# Patient Record
Sex: Male | Born: 1960 | Race: White | Hispanic: No | Marital: Married | Smoking: Current every day smoker
Health system: Southern US, Community
[De-identification: ages and names within clinical notes are randomized; demographics above are authoritative.]

## PROBLEM LIST (undated history)

## (undated) DIAGNOSIS — E7800 Pure hypercholesterolemia, unspecified: Secondary | ICD-10-CM

## (undated) DIAGNOSIS — K6389 Other specified diseases of intestine: Secondary | ICD-10-CM

## (undated) DIAGNOSIS — R000 Tachycardia, unspecified: Secondary | ICD-10-CM

## (undated) DIAGNOSIS — R05 Cough: Secondary | ICD-10-CM

## (undated) HISTORY — PX: OTHER SURGICAL HISTORY: SHX169

## (undated) HISTORY — DX: Other specified diseases of intestine: K63.89

---

## 2013-03-25 ENCOUNTER — Encounter (INDEPENDENT_AMBULATORY_CARE_PROVIDER_SITE_OTHER): Payer: Self-pay

## 2013-03-27 ENCOUNTER — Ambulatory Visit (INDEPENDENT_AMBULATORY_CARE_PROVIDER_SITE_OTHER): Payer: BC Managed Care – PPO | Admitting: General Surgery

## 2013-03-27 ENCOUNTER — Telehealth (INDEPENDENT_AMBULATORY_CARE_PROVIDER_SITE_OTHER): Payer: Self-pay

## 2013-03-27 ENCOUNTER — Encounter (INDEPENDENT_AMBULATORY_CARE_PROVIDER_SITE_OTHER): Payer: Self-pay | Admitting: General Surgery

## 2013-03-27 ENCOUNTER — Encounter (INDEPENDENT_AMBULATORY_CARE_PROVIDER_SITE_OTHER): Payer: Self-pay

## 2013-03-27 VITALS — BP 162/100 | HR 88 | Temp 98.9°F | Resp 15 | Ht 70.0 in | Wt 198.8 lb

## 2013-03-27 DIAGNOSIS — D126 Benign neoplasm of colon, unspecified: Secondary | ICD-10-CM

## 2013-03-27 NOTE — Progress Notes (Signed)
Patient ID: Wesley Tran, male   DOB: 11-05-1960, 52 y.o.   MRN: [id number]  Chief Complaint  Patient presents with  . New Evaluation    eval sigmoid mass/colon ca    HPI Wesley Tran is a 52 y.o. male.   HPI  He is referred by Dr. Ewing Schlein for further evaluation and treatment of a sigmoid colon mass. He is here with his wife and his employer.  He underwent a screening colonoscopy at the end of August. This was done in [place]. This demonstrated a polyp in the transverse colon which was removed. This demonstrated a moderate to large mass in the sigmoid colon which was biopsied. The transverse colon polyp was benign. The sigmoid mass demonstrated a tubulovillous adenoma. CT scan was performed which demonstrated the mass in the rectosigmoid area. CEA level was normal. He is asymptomatic and has no rectal bleeding. He saw Dr. Ewing Schlein for a second opinion with respect to treatment options and then was referred to me.  Past Medical History  Diagnosis Date  . Colonic mass     History reviewed. No pertinent past surgical history.  Family History  Problem Relation Age of Onset  . Cancer Mother     ovarian    Social History History  Substance Use Topics  . Smoking status: Current Every Day Smoker -- 0.50 packs/day  . Smokeless tobacco: Never Used  . Alcohol Use: Yes     Comment: weekend drinker    Not on File  No current outpatient prescriptions on file.   No current facility-administered medications for this visit.    Review of Systems Review of Systems  Constitutional: Negative.   HENT: Negative.   Respiratory: Negative.   Cardiovascular: Negative.   Gastrointestinal: Negative.   Genitourinary: Negative.   Musculoskeletal: Negative.   Neurological: Negative.   Hematological: Negative.     Blood pressure 162/100, pulse 88, temperature 98.9 F (37.2 C), temperature source Temporal, resp. rate 15, height 5\' 10"  (1.778 m), weight 198 lb 12.8 oz (90.175 kg).  Physical  Exam Physical Exam  Constitutional: He appears well-developed and well-nourished. No distress.  HENT:  Head: Normocephalic and atraumatic.  Eyes: EOM are normal. No scleral icterus.  Neck: Neck supple.  Cardiovascular: Normal rate and regular rhythm.   Pulmonary/Chest: Effort normal and breath sounds normal.  Abdominal: Soft. He exhibits no distension and no mass. There is no tenderness.  Musculoskeletal: He exhibits no edema.  Lymphadenopathy:    He has no cervical adenopathy.  Neurological: He is alert.  Skin: Skin is warm and dry.  Psychiatric: He has a normal mood and affect. His behavior is normal.    Data Reviewed Colonoscopy report. Pathology report. CT scan report. Note from Dr. Ewing Schlein.  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 This cannot be removed endoscopically. I explained to him that there was a chance that was harboring malignancy.     Plan    Laparoscopic-assisted partial colectomy.  I have explained the procedure and risks of colon resection.  Risks include but are not limited to bleeding, infection, wound problems, anesthesia, anastomotic leak, need for colostomy, injury to intraabominal organs (such as intestine, spleen, kidney, bladder, ureter, etc.), ileus, irregular bowel habits.  He seems to understand and agrees to proceed.   He will be given a one-day bowel prep.       Ebbie Cherry J 03/27/2013, 12:53 PM

## 2013-03-27 NOTE — Telephone Encounter (Signed)
No notes in encounter. 

## 2013-03-27 NOTE — Patient Instructions (Signed)
[facility]  ONE-DAY (1) PRE-OP HOME COLON PREP INSTRUCTIONS: ** MIRALAX / GATORADE PREP **  Fill the two prescriptions at a pharmacy of your choice.  You must follow the instructions below carefully.  If you have questions or problems, please call and speak to someone in the clinic department at our office:   [phone].  MIRALAX - GATORADE -- DULCOLAX TABS:   Fill the prescriptions for MIRALAX  (255 gm bottle)    In addition, purchase four (4) DULCOLAX TABLETS (no prescription required), and one 64 oz GATORADE.  (Do NOT purchase red Gatorade; any other flavor is acceptable).  ANITIBIOTICS:   There will be 2 different antibiotics.     Take both prescriptions THE AFTERNOON BEFORE your surgery, at the times written on the bottles.  INSTRUCTIONS: 1. Five days prior to your procedure do not eat nuts, popcorn, or fruit with seeds.  Stop all fiber supplements such as Metamucil, Citrucel, etc.  2. The day before your procedure: o 6:00am:  take (4) Dulcolax tablets.  You should remain on clear liquids for the entire day.   CLEAR LIQUIDS: clear bouillon, broth, jello (NOT RED), black coffee, tea, soda, etc o 10:00am:  add the bottle of MiraLax to the 64-oz bottle of Gatorade, and dissolve.  Begin drinking the Gatorade mixture until gone (8 oz every 15-30 minutes).  Continue clear liquids until midnight (or bedtime). o Take the antibiotics at the times instructed on the bottles.  3. The day of your procedure:   Do not eat or drink ANYTHING after midnight before your surgery.     If you take Heart or Blood Pressure medicine, ask the pre-op nurses about these during your preop appointment.   Further pre-operative instructions will be given to you from the hospital.   Expect to be contacted 5-7 days before your surgery.       

## 2013-04-10 ENCOUNTER — Encounter (INDEPENDENT_AMBULATORY_CARE_PROVIDER_SITE_OTHER): Payer: Self-pay

## 2013-04-16 ENCOUNTER — Encounter (HOSPITAL_COMMUNITY): Payer: Self-pay | Admitting: Pharmacy Technician

## 2013-04-17 NOTE — Patient Instructions (Signed)
20 [name]  04/17/2013   Your procedure is scheduled on:  04/30/13  TUESDAY  Report to [facility] at  0530     AM.  Call this number if you have problems the morning of surgery: [phone]       Remember: BOWEL PREP AS PER OFFICE  Do not  Take ANYTHING BY MOUTH :After Midnight. Monday NIGHT   Take these medicines the morning of surgery with A SIP OF WATER:none   .  Contacts, dentures or partial plates can not be worn to surgery  Leave suitcase in the car. After surgery it may be brought to your room.  For patients admitted to the hospital, checkout time is 11:00 AM day of  discharge.             SPECIAL INSTRUCTIONS- SEE  PREPARING FOR SURGERY INSTRUCTION SHEET-     DO NOT WEAR JEWELRY, LOTIONS, POWDERS, OR PERFUMES.  WOMEN-- DO NOT SHAVE LEGS OR UNDERARMS FOR 12 HOURS BEFORE SHOWERS. MEN MAY SHAVE FACE.  Patients discharged the day of surgery will not be allowed to drive home. IF going home the day of surgery, you must have a driver and someone to stay with you for the first 24 hours  Name and phone number of your driver:   ADMISSION                                                                                  [name]  [phone]  [phone]                 FAILURE TO FOLLOW THESE INSTRUCTIONS MAY RESULT IN  CANCELLATION   OF YOUR SURGERY                                                  Patient Signature _____________________________

## 2013-04-18 ENCOUNTER — Encounter (HOSPITAL_COMMUNITY)
Admission: RE | Admit: 2013-04-18 | Discharge: 2013-04-18 | Disposition: A | Discharge status: Home / Self Care | Payer: BC Managed Care – PPO | Source: Ambulatory Visit | Attending: General Surgery | Admitting: General Surgery

## 2013-04-18 ENCOUNTER — Other Ambulatory Visit: Payer: Self-pay

## 2013-04-18 ENCOUNTER — Ambulatory Visit (HOSPITAL_COMMUNITY)
Admission: RE | Admit: 2013-04-18 | Discharge: 2013-04-18 | Disposition: A | Discharge status: Home / Self Care | Payer: BC Managed Care – PPO | Source: Ambulatory Visit | Attending: General Surgery | Admitting: General Surgery

## 2013-04-18 ENCOUNTER — Telehealth (INDEPENDENT_AMBULATORY_CARE_PROVIDER_SITE_OTHER): Payer: Self-pay | Admitting: *Deleted

## 2013-04-18 ENCOUNTER — Encounter (HOSPITAL_COMMUNITY): Payer: Self-pay

## 2013-04-18 DIAGNOSIS — Z01810 Encounter for preprocedural cardiovascular examination: Secondary | ICD-10-CM | POA: Insufficient documentation

## 2013-04-18 DIAGNOSIS — R059 Cough, unspecified: Secondary | ICD-10-CM

## 2013-04-18 DIAGNOSIS — Z01812 Encounter for preprocedural laboratory examination: Secondary | ICD-10-CM | POA: Insufficient documentation

## 2013-04-18 DIAGNOSIS — Z01818 Encounter for other preprocedural examination: Secondary | ICD-10-CM | POA: Insufficient documentation

## 2013-04-18 DIAGNOSIS — R000 Tachycardia, unspecified: Secondary | ICD-10-CM

## 2013-04-18 HISTORY — DX: Tachycardia, unspecified: R00.0

## 2013-04-18 HISTORY — DX: Cough, unspecified: R05.9

## 2013-04-18 HISTORY — DX: Cough: R05

## 2013-04-18 HISTORY — DX: Pure hypercholesterolemia, unspecified: E78.00

## 2013-04-18 LAB — PROTIME-INR
INR: 1.01 (ref 0.00–1.49)
Prothrombin Time: 13.1 seconds (ref 11.6–15.2)

## 2013-04-18 LAB — CBC WITH DIFFERENTIAL/PLATELET
Basophils Relative: 0 % (ref 0–1)
Eosinophils Absolute: 0.2 10*3/uL (ref 0.0–0.7)
Eosinophils Relative: 1 % (ref 0–5)
HCT: 44.3 % (ref 39.0–52.0)
Hemoglobin: 14.4 g/dL (ref 13.0–17.0)
MCH: 30.5 pg (ref 26.0–34.0)
MCHC: 32.5 g/dL (ref 30.0–36.0)
MCV: 93.9 fL (ref 78.0–100.0)
Monocytes Absolute: 1.6 10*3/uL — ABNORMAL HIGH (ref 0.1–1.0)
Monocytes Relative: 8 % (ref 3–12)
RBC: 4.72 MIL/uL (ref 4.22–5.81)

## 2013-04-18 LAB — COMPREHENSIVE METABOLIC PANEL
Albumin: 3.7 g/dL (ref 3.5–5.2)
BUN: 17 mg/dL (ref 6–23)
Calcium: 9.9 mg/dL (ref 8.4–10.5)
Creatinine, Ser: 0.78 mg/dL (ref 0.50–1.35)
Total Protein: 7.7 g/dL (ref 6.0–8.3)

## 2013-04-18 NOTE — Telephone Encounter (Signed)
Needs to seen by PMD ASAP.

## 2013-04-18 NOTE — Progress Notes (Signed)
CT chest  8/14 EPIC

## 2013-04-18 NOTE — Progress Notes (Signed)
PST VISIT NOTE-  Patient and wife stated he had a fever to  102 on Saturday night. hasnt had any since but is coughing with nasal drainage- ? Color. He and wife state he will follow up with PCP today or tomorrow. Coughing here at visit- sounds loose, but congested

## 2013-04-18 NOTE — Telephone Encounter (Signed)
Received a call from [place] with Pre-op stating that patient told her that on Saturday he had a 102F temperature but has not had a fever since.  Patient has had continued coughing and congestion.  Patient was advised to follow up with PMD but did have labs drawn today that showed WBC of 19.2.  Verlon Au just wanted to make sure Dr. Abbey Chatters was aware of what was going on.  Explained that I will send a message to Dr. Abbey Chatters to make him aware.

## 2013-04-18 NOTE — Progress Notes (Signed)
[place] [employer] at PST visit- Adrian Saran- # [id number] Amil Amen utilized for medications, allergies and surgical consent.  Phone became disconnected and both patient and husband did not want the call to be returned. Both said they understood english and did not need or want the interpretor.  Faxed over CBC and CMP to Dr Abbey Chatters along with note regarding patients cough.  Also spoke with Baxter Hire at [facility] who will be sure Dr Abbey Chatters is aware

## 2013-04-29 NOTE — Progress Notes (Signed)
Spoke with patient's wife about follow-up with primary care physcian. She stated he saw PCP and was placed on antibiotics.

## 2013-04-29 NOTE — Anesthesia Preprocedure Evaluation (Addendum)
Anesthesia Evaluation  Patient identified by MRN, date of birth, ID band Patient awake    Reviewed: Allergy & Precautions, H&P , NPO status , Patient's Chart, lab work & pertinent test results  Airway Mallampati: II TM Distance: >3 FB Neck ROM: Full    Dental  (+) Teeth Intact and Dental Advisory Given   Pulmonary neg pulmonary ROS, Current Smoker,  breath sounds clear to auscultation  Pulmonary exam normal       Cardiovascular negative cardio ROS  Rhythm:Regular Rate:Normal     Neuro/Psych negative neurological ROS  negative psych ROS   GI/Hepatic Neg liver ROS, Sigmoid colon mass   Endo/Other  negative endocrine ROS  Renal/GU negative Renal ROS  negative genitourinary   Musculoskeletal negative musculoskeletal ROS (+)   Abdominal   Peds  Hematology negative hematology ROS (+)   Anesthesia Other Findings   Reproductive/Obstetrics                          Anesthesia Physical Anesthesia Plan  ASA: II  Anesthesia Plan: General   Post-op Pain Management:    Induction: Intravenous  Airway Management Planned: Oral ETT  Additional Equipment:   Intra-op Plan:   Post-operative Plan: Extubation in OR  Informed Consent: I have reviewed the patients History and Physical, chart, labs and discussed the procedure including the risks, benefits and alternatives for the proposed anesthesia with the patient or authorized representative who has indicated his/her understanding and acceptance.   Dental advisory given  Plan Discussed with: CRNA  Anesthesia Plan Comments:         Anesthesia Quick Evaluation

## 2013-04-30 ENCOUNTER — Inpatient Hospital Stay (HOSPITAL_COMMUNITY): Payer: BC Managed Care – PPO | Admitting: Anesthesiology

## 2013-04-30 ENCOUNTER — Encounter (HOSPITAL_COMMUNITY): Payer: BC Managed Care – PPO | Admitting: Anesthesiology

## 2013-04-30 ENCOUNTER — Inpatient Hospital Stay (HOSPITAL_COMMUNITY)
Admission: RE | Admit: 2013-04-30 | Discharge: 2013-05-06 | DRG: 333 | Disposition: A | Discharge status: Home / Self Care | Payer: BC Managed Care – PPO | Source: Ambulatory Visit | Attending: General Surgery | Admitting: General Surgery

## 2013-04-30 ENCOUNTER — Encounter (HOSPITAL_COMMUNITY)
Admission: RE | Disposition: A | Discharge status: Home / Self Care | Payer: Self-pay | Source: Ambulatory Visit | Attending: General Surgery

## 2013-04-30 ENCOUNTER — Encounter (HOSPITAL_COMMUNITY): Payer: Self-pay | Admitting: *Deleted

## 2013-04-30 DIAGNOSIS — Q438 Other specified congenital malformations of intestine: Secondary | ICD-10-CM

## 2013-04-30 DIAGNOSIS — IMO0002 Reserved for concepts with insufficient information to code with codable children: Secondary | ICD-10-CM | POA: Diagnosis not present

## 2013-04-30 DIAGNOSIS — Z79899 Other long term (current) drug therapy: Secondary | ICD-10-CM

## 2013-04-30 DIAGNOSIS — Z01812 Encounter for preprocedural laboratory examination: Secondary | ICD-10-CM

## 2013-04-30 DIAGNOSIS — D371 Neoplasm of uncertain behavior of stomach: Secondary | ICD-10-CM

## 2013-04-30 DIAGNOSIS — K560 Paralytic ileus: Secondary | ICD-10-CM | POA: Diagnosis not present

## 2013-04-30 DIAGNOSIS — Z01810 Encounter for preprocedural cardiovascular examination: Secondary | ICD-10-CM

## 2013-04-30 DIAGNOSIS — Y836 Removal of other organ (partial) (total) as the cause of abnormal reaction of the patient, or of later complication, without mention of misadventure at the time of the procedure: Secondary | ICD-10-CM | POA: Diagnosis not present

## 2013-04-30 DIAGNOSIS — K6289 Other specified diseases of anus and rectum: Secondary | ICD-10-CM | POA: Insufficient documentation

## 2013-04-30 DIAGNOSIS — D126 Benign neoplasm of colon, unspecified: Secondary | ICD-10-CM | POA: Diagnosis present

## 2013-04-30 DIAGNOSIS — Y839 Surgical procedure, unspecified as the cause of abnormal reaction of the patient, or of later complication, without mention of misadventure at the time of the procedure: Secondary | ICD-10-CM | POA: Diagnosis not present

## 2013-04-30 DIAGNOSIS — K929 Disease of digestive system, unspecified: Secondary | ICD-10-CM | POA: Diagnosis present

## 2013-04-30 DIAGNOSIS — N9989 Other postprocedural complications and disorders of genitourinary system: Secondary | ICD-10-CM | POA: Diagnosis not present

## 2013-04-30 DIAGNOSIS — R339 Retention of urine, unspecified: Secondary | ICD-10-CM | POA: Diagnosis not present

## 2013-04-30 DIAGNOSIS — D375 Neoplasm of uncertain behavior of rectum: Secondary | ICD-10-CM

## 2013-04-30 DIAGNOSIS — F17200 Nicotine dependence, unspecified, uncomplicated: Secondary | ICD-10-CM | POA: Diagnosis present

## 2013-04-30 DIAGNOSIS — E7800 Pure hypercholesterolemia, unspecified: Secondary | ICD-10-CM | POA: Diagnosis present

## 2013-04-30 DIAGNOSIS — D128 Benign neoplasm of rectum: Principal | ICD-10-CM | POA: Diagnosis present

## 2013-04-30 DIAGNOSIS — Z791 Long term (current) use of non-steroidal anti-inflammatories (NSAID): Secondary | ICD-10-CM

## 2013-04-30 DIAGNOSIS — D378 Neoplasm of uncertain behavior of other specified digestive organs: Secondary | ICD-10-CM

## 2013-04-30 HISTORY — PX: LAPAROSCOPIC PARTIAL COLECTOMY: SHX5907

## 2013-04-30 LAB — TYPE AND SCREEN
ABO/RH(D): O POS
Antibody Screen: NEGATIVE

## 2013-04-30 LAB — CBC
HCT: 43.7 % (ref 39.0–52.0)
Hemoglobin: 13.8 g/dL (ref 13.0–17.0)
MCH: 31.3 pg (ref 26.0–34.0)
MCHC: 33.9 g/dL (ref 30.0–36.0)
MCV: 93.1 fL (ref 78.0–100.0)
Platelets: 289 10*3/uL (ref 150–400)
RBC: 4.5 MIL/uL (ref 4.22–5.81)
RDW: 13.2 % (ref 11.5–15.5)
WBC: 12.8 10*3/uL — ABNORMAL HIGH (ref 4.0–10.5)
WBC: 27.8 10*3/uL — ABNORMAL HIGH (ref 4.0–10.5)

## 2013-04-30 LAB — CREATININE, SERUM
Creatinine, Ser: 0.69 mg/dL (ref 0.50–1.35)
GFR calc Af Amer: >90 mL/min (ref >90)
GFR calc non Af Amer: >90 mL/min (ref >90)

## 2013-04-30 LAB — ABO/RH: ABO/RH(D): O POS

## 2013-04-30 SURGERY — LAPAROSCOPIC PARTIAL COLECTOMY
Anesthesia: General | Site: Abdomen

## 2013-04-30 MED ORDER — LACTATED RINGERS IV SOLN
INTRAVENOUS | Status: DC | PRN
  Administered 2013-04-30 (×3): via INTRAVENOUS

## 2013-04-30 MED ORDER — NEOSTIGMINE METHYLSULFATE 1 MG/ML IJ SOLN
INTRAMUSCULAR | Status: DC | PRN
  Administered 2013-04-30: 4 mg via INTRAVENOUS

## 2013-04-30 MED ORDER — ONDANSETRON HCL 4 MG/2ML IJ SOLN
4.0000 mg | INTRAMUSCULAR | Status: DC | PRN
  Administered 2013-05-03 – 2013-05-05 (×7): 4 mg via INTRAVENOUS
  Filled 2013-04-30 (×7): qty 2

## 2013-04-30 MED ORDER — PROMETHAZINE HCL 25 MG/ML IJ SOLN: 6.2500 mg | INTRAMUSCULAR | Status: DC | PRN

## 2013-04-30 MED ORDER — SUFENTANIL CITRATE 50 MCG/ML IV SOLN
INTRAVENOUS | Status: DC | PRN
  Administered 2013-04-30: 5 ug via INTRAVENOUS
  Administered 2013-04-30: 10 ug via INTRAVENOUS
  Administered 2013-04-30 (×2): 5 ug via INTRAVENOUS
  Administered 2013-04-30 (×2): 10 ug via INTRAVENOUS
  Administered 2013-04-30 (×3): 5 ug via INTRAVENOUS
  Administered 2013-04-30 (×2): 10 ug via INTRAVENOUS
  Administered 2013-04-30 (×2): 5 ug via INTRAVENOUS

## 2013-04-30 MED ORDER — HYDROMORPHONE HCL PF 1 MG/ML IJ SOLN
INTRAMUSCULAR | Status: AC
  Filled 2013-04-30: qty 1

## 2013-04-30 MED ORDER — SUFENTANIL CITRATE 50 MCG/ML IV SOLN
INTRAVENOUS | Status: AC
  Filled 2013-04-30: qty 1

## 2013-04-30 MED ORDER — DIPHENHYDRAMINE HCL 12.5 MG/5ML PO ELIX: 12.5000 mg | ORAL_SOLUTION | Freq: Four times a day (QID) | ORAL | Status: DC | PRN

## 2013-04-30 MED ORDER — ONDANSETRON HCL 4 MG/2ML IJ SOLN: 4.0000 mg | Freq: Four times a day (QID) | INTRAMUSCULAR | Status: DC | PRN

## 2013-04-30 MED ORDER — HYDROMORPHONE HCL PF 1 MG/ML IJ SOLN
INTRAMUSCULAR | Status: DC | PRN
  Administered 2013-04-30 (×4): 0.5 mg via INTRAVENOUS

## 2013-04-30 MED ORDER — DIPHENHYDRAMINE HCL 50 MG/ML IJ SOLN: 12.5000 mg | Freq: Four times a day (QID) | INTRAMUSCULAR | Status: DC | PRN

## 2013-04-30 MED ORDER — ESMOLOL HCL 10 MG/ML IV SOLN
INTRAVENOUS | Status: DC | PRN
  Administered 2013-04-30: 20 mg via INTRAVENOUS

## 2013-04-30 MED ORDER — DEXAMETHASONE SODIUM PHOSPHATE 10 MG/ML IJ SOLN
INTRAMUSCULAR | Status: AC
  Filled 2013-04-30: qty 1

## 2013-04-30 MED ORDER — LACTATED RINGERS IR SOLN
Status: DC | PRN
  Administered 2013-04-30: 1000 mL

## 2013-04-30 MED ORDER — MORPHINE SULFATE (PF) 1 MG/ML IV SOLN
INTRAVENOUS | Status: AC
  Administered 2013-04-30: 18:00:00
  Administered 2013-04-30: 1.5 mg
  Filled 2013-04-30: qty 25

## 2013-04-30 MED ORDER — MIDAZOLAM HCL 5 MG/5ML IJ SOLN
INTRAMUSCULAR | Status: DC | PRN
  Administered 2013-04-30 (×2): 1 mg via INTRAVENOUS

## 2013-04-30 MED ORDER — CISATRACURIUM BESYLATE (PF) 10 MG/5ML IV SOLN
INTRAVENOUS | Status: DC | PRN
  Administered 2013-04-30 (×2): 4 mg via INTRAVENOUS
  Administered 2013-04-30: 5 mg via INTRAVENOUS
  Administered 2013-04-30: 3 mg via INTRAVENOUS

## 2013-04-30 MED ORDER — SUCCINYLCHOLINE CHLORIDE 20 MG/ML IJ SOLN
INTRAMUSCULAR | Status: AC
  Filled 2013-04-30: qty 1

## 2013-04-30 MED ORDER — ONDANSETRON HCL 4 MG/2ML IJ SOLN
INTRAMUSCULAR | Status: DC | PRN
  Administered 2013-04-30: 4 mg via INTRAVENOUS

## 2013-04-30 MED ORDER — SODIUM CHLORIDE 0.9 % IJ SOLN: 9.0000 mL | INTRAMUSCULAR | Status: DC | PRN

## 2013-04-30 MED ORDER — DEXAMETHASONE SODIUM PHOSPHATE 10 MG/ML IJ SOLN
INTRAMUSCULAR | Status: DC | PRN
  Administered 2013-04-30: 5 mg via INTRAVENOUS

## 2013-04-30 MED ORDER — MORPHINE SULFATE (PF) 1 MG/ML IV SOLN
INTRAVENOUS | Status: DC
  Administered 2013-04-30: 6 mg via INTRAVENOUS
  Administered 2013-04-30: 18 mg via INTRAVENOUS
  Administered 2013-04-30: 12:00:00 via INTRAVENOUS
  Administered 2013-05-01: 1.5 mg via INTRAVENOUS
  Administered 2013-05-01: 7.5 mg via INTRAVENOUS
  Administered 2013-05-01: 5.56 mg via INTRAVENOUS
  Administered 2013-05-01 (×2): 4.5 mg via INTRAVENOUS
  Administered 2013-05-01: 13:00:00 via INTRAVENOUS
  Administered 2013-05-02: 3 mg via INTRAVENOUS
  Administered 2013-05-02: 19:00:00 via INTRAVENOUS
  Administered 2013-05-02: 7.35 mg via INTRAVENOUS
  Administered 2013-05-02: 4.5 mg via INTRAVENOUS
  Administered 2013-05-02 – 2013-05-03 (×3): 3 mg via INTRAVENOUS
  Administered 2013-05-03: 2.52 mg via INTRAVENOUS
  Administered 2013-05-03: 7.5 mg via INTRAVENOUS
  Administered 2013-05-03 – 2013-05-05 (×4): 1.5 mg via INTRAVENOUS
  Filled 2013-04-30 (×4): qty 25

## 2013-04-30 MED ORDER — HYDROMORPHONE HCL PF 1 MG/ML IJ SOLN
0.2500 mg | INTRAMUSCULAR | Status: DC | PRN
  Administered 2013-04-30 (×2): 0.5 mg via INTRAVENOUS

## 2013-04-30 MED ORDER — HYDROMORPHONE HCL PF 2 MG/ML IJ SOLN
INTRAMUSCULAR | Status: AC
  Filled 2013-04-30: qty 1

## 2013-04-30 MED ORDER — GLYCOPYRROLATE 0.2 MG/ML IJ SOLN
INTRAMUSCULAR | Status: DC | PRN
  Administered 2013-04-30: 0.6 mg via INTRAVENOUS

## 2013-04-30 MED ORDER — CISATRACURIUM BESYLATE 20 MG/10ML IV SOLN
INTRAVENOUS | Status: AC
  Filled 2013-04-30: qty 10

## 2013-04-30 MED ORDER — BUPIVACAINE HCL (PF) 0.5 % IJ SOLN
INTRAMUSCULAR | Status: AC
  Filled 2013-04-30: qty 30

## 2013-04-30 MED ORDER — MIDAZOLAM HCL 2 MG/2ML IJ SOLN
INTRAMUSCULAR | Status: AC
  Filled 2013-04-30: qty 2

## 2013-04-30 MED ORDER — DEXTROSE 5 % IV SOLN
2.0000 g | Freq: Two times a day (BID) | INTRAVENOUS | Status: AC
  Administered 2013-04-30: 2 g via INTRAVENOUS
  Filled 2013-04-30: qty 2

## 2013-04-30 MED ORDER — BUPIVACAINE HCL (PF) 0.5 % IJ SOLN
INTRAMUSCULAR | Status: DC | PRN
  Administered 2013-04-30: 10 mL

## 2013-04-30 MED ORDER — DEXTROSE 5 % IV SOLN
INTRAVENOUS | Status: AC
  Filled 2013-04-30: qty 1

## 2013-04-30 MED ORDER — DEXTROSE 5 % IV SOLN
2.0000 g | INTRAVENOUS | Status: AC
  Administered 2013-04-30: 2 g via INTRAVENOUS
  Filled 2013-04-30: qty 2

## 2013-04-30 MED ORDER — LACTATED RINGERS IV SOLN
INTRAVENOUS | Status: DC
  Administered 2013-04-30: 13:00:00 via INTRAVENOUS

## 2013-04-30 MED ORDER — HEPARIN SODIUM (PORCINE) 5000 UNIT/ML IJ SOLN
5000.0000 [IU] | Freq: Three times a day (TID) | INTRAMUSCULAR | Status: DC
  Administered 2013-05-01 – 2013-05-06 (×16): 5000 [IU] via SUBCUTANEOUS
  Filled 2013-04-30 (×19): qty 1

## 2013-04-30 MED ORDER — METOCLOPRAMIDE HCL 5 MG/ML IJ SOLN
INTRAMUSCULAR | Status: DC | PRN
  Administered 2013-04-30: 10 mg via INTRAVENOUS

## 2013-04-30 MED ORDER — SPOT INK MARKER SYRINGE KIT
PACK | SUBMUCOSAL | Status: AC
  Filled 2013-04-30: qty 5

## 2013-04-30 MED ORDER — GLYCOPYRROLATE 0.2 MG/ML IJ SOLN
INTRAMUSCULAR | Status: AC
  Filled 2013-04-30: qty 1

## 2013-04-30 MED ORDER — NALOXONE HCL 0.4 MG/ML IJ SOLN: 0.4000 mg | INTRAMUSCULAR | Status: DC | PRN

## 2013-04-30 MED ORDER — SUCCINYLCHOLINE CHLORIDE 20 MG/ML IJ SOLN
INTRAMUSCULAR | Status: DC | PRN
  Administered 2013-04-30: 100 mg via INTRAVENOUS

## 2013-04-30 MED ORDER — SODIUM CHLORIDE 0.9 % IJ SOLN
INTRAMUSCULAR | Status: AC
  Filled 2013-04-30: qty 20

## 2013-04-30 MED ORDER — EPHEDRINE SULFATE 50 MG/ML IJ SOLN
INTRAMUSCULAR | Status: AC
  Filled 2013-04-30: qty 1

## 2013-04-30 MED ORDER — KCL-LACTATED RINGERS-D5W 20 MEQ/L IV SOLN
INTRAVENOUS | Status: DC
  Administered 2013-05-01 – 2013-05-04 (×8): via INTRAVENOUS
  Administered 2013-05-04: 80 mL/h via INTRAVENOUS
  Administered 2013-05-05: 03:00:00 via INTRAVENOUS
  Administered 2013-05-05: 80 mL/h via INTRAVENOUS
  Administered 2013-05-06: 04:00:00 via INTRAVENOUS
  Filled 2013-04-30 (×20): qty 1000

## 2013-04-30 MED ORDER — BIOTENE DRY MOUTH MT LIQD
15.0000 mL | Freq: Two times a day (BID) | OROMUCOSAL | Status: DC
  Administered 2013-04-30 – 2013-05-05 (×7): 15 mL via OROMUCOSAL

## 2013-04-30 MED ORDER — SODIUM CHLORIDE 0.9 % IJ SOLN
INTRAMUSCULAR | Status: AC
  Filled 2013-04-30: qty 10

## 2013-04-30 MED ORDER — ONDANSETRON HCL 4 MG PO TABS: 4.0000 mg | ORAL_TABLET | Freq: Four times a day (QID) | ORAL | Status: DC | PRN

## 2013-04-30 MED ORDER — LIDOCAINE HCL (CARDIAC) 20 MG/ML IV SOLN
INTRAVENOUS | Status: DC | PRN
  Administered 2013-04-30: 60 mg via INTRAVENOUS

## 2013-04-30 MED ORDER — ATROPINE SULFATE 0.4 MG/ML IJ SOLN
INTRAMUSCULAR | Status: AC
  Filled 2013-04-30: qty 1

## 2013-04-30 MED ORDER — PANTOPRAZOLE SODIUM 40 MG IV SOLR
40.0000 mg | INTRAVENOUS | Status: DC
  Administered 2013-04-30 – 2013-05-05 (×6): 40 mg via INTRAVENOUS
  Filled 2013-04-30 (×7): qty 40

## 2013-04-30 MED ORDER — PROPOFOL 10 MG/ML IV BOLUS
INTRAVENOUS | Status: AC
  Filled 2013-04-30: qty 20

## 2013-04-30 MED ORDER — GLYCOPYRROLATE 0.2 MG/ML IJ SOLN
INTRAMUSCULAR | Status: AC
  Filled 2013-04-30: qty 2

## 2013-04-30 MED ORDER — PROPOFOL 10 MG/ML IV BOLUS
INTRAVENOUS | Status: DC | PRN
  Administered 2013-04-30: 200 mg via INTRAVENOUS

## 2013-04-30 MED ORDER — NEOSTIGMINE METHYLSULFATE 1 MG/ML IJ SOLN
INTRAMUSCULAR | Status: AC
  Filled 2013-04-30: qty 10

## 2013-04-30 MED ORDER — ALVIMOPAN 12 MG PO CAPS: 12.0000 mg | ORAL_CAPSULE | Freq: Two times a day (BID) | ORAL | Status: DC

## 2013-04-30 SURGICAL SUPPLY — 73 items
APPLIER CLIP 5 13 M/L LIGAMAX5 (MISCELLANEOUS)
APPLIER CLIP ROT 10 11.4 M/L (STAPLE)
BLADE EXTENDED COATED 6.5IN (ELECTRODE) ×2 IMPLANT
BLADE HEX COATED 2.75 (ELECTRODE) ×4 IMPLANT
BLADE SURG SZ10 CARB STEEL (BLADE) ×2 IMPLANT
CABLE HIGH FREQUENCY MONO STRZ (ELECTRODE) ×2 IMPLANT
CANISTER SUCTION 2500CC (MISCELLANEOUS) ×2 IMPLANT
CELLS DAT CNTRL 66122 CELL SVR (MISCELLANEOUS) IMPLANT
CLIP APPLIE 5 13 M/L LIGAMAX5 (MISCELLANEOUS) IMPLANT
CLIP APPLIE ROT 10 11.4 M/L (STAPLE) IMPLANT
COUNTER NEEDLE 20 DBL MAG RED (NEEDLE) ×2 IMPLANT
COVER MAYO STAND STRL (DRAPES) ×4 IMPLANT
DECANTER SPIKE VIAL GLASS SM (MISCELLANEOUS) ×2 IMPLANT
DISSECTOR BLUNT TIP ENDO 5MM (MISCELLANEOUS) IMPLANT
DRAIN CHANNEL 19F RND (DRAIN) ×2 IMPLANT
DRAPE LAPAROSCOPIC ABDOMINAL (DRAPES) ×2 IMPLANT
DRAPE LG THREE QUARTER DISP (DRAPES) ×6 IMPLANT
DRAPE UTILITY XL STRL (DRAPES) ×4 IMPLANT
DRAPE WARM FLUID 44X44 (DRAPE) ×2 IMPLANT
DRSG OPSITE POSTOP 4X10 (GAUZE/BANDAGES/DRESSINGS) IMPLANT
DRSG OPSITE POSTOP 4X6 (GAUZE/BANDAGES/DRESSINGS) IMPLANT
DRSG OPSITE POSTOP 4X8 (GAUZE/BANDAGES/DRESSINGS) ×2 IMPLANT
ELECT REM PT RETURN 9FT ADLT (ELECTROSURGICAL) ×2
ELECTRODE REM PT RTRN 9FT ADLT (ELECTROSURGICAL) ×1 IMPLANT
EVACUATOR SILICONE 100CC (DRAIN) ×2 IMPLANT
FILTER SMOKE EVAC LAPAROSHD (FILTER) IMPLANT
GLOVE ECLIPSE 8.0 STRL XLNG CF (GLOVE) ×8 IMPLANT
GLOVE INDICATOR 8.0 STRL GRN (GLOVE) ×8 IMPLANT
GOWN STRL REIN XL XLG (GOWN DISPOSABLE) ×12 IMPLANT
KIT BASIN OR (CUSTOM PROCEDURE TRAY) ×2 IMPLANT
LEGGING LITHOTOMY PAIR STRL (DRAPES) ×2 IMPLANT
LIGASURE IMPACT 36 18CM CVD LR (INSTRUMENTS) ×2 IMPLANT
PENCIL BUTTON HOLSTER BLD 10FT (ELECTRODE) ×4 IMPLANT
RELOAD PROXIMATE 75MM BLUE (ENDOMECHANICALS) ×2 IMPLANT
RTRCTR WOUND ALEXIS 18CM MED (MISCELLANEOUS)
SCALPEL HARMONIC ACE (MISCELLANEOUS) IMPLANT
SCISSORS LAP 5X35 DISP (ENDOMECHANICALS) ×2 IMPLANT
SET IRRIG TUBING LAPAROSCOPIC (IRRIGATION / IRRIGATOR) ×2 IMPLANT
SLEEVE XCEL OPT CAN 5 100 (ENDOMECHANICALS) ×6 IMPLANT
SOLUTION ANTI FOG 6CC (MISCELLANEOUS) ×2 IMPLANT
SPONGE GAUZE 4X4 12PLY (GAUZE/BANDAGES/DRESSINGS) ×2 IMPLANT
SPONGE LAP 18X18 X RAY DECT (DISPOSABLE) ×6 IMPLANT
STAPLER CIRC CVD 29MM 37CM (STAPLE) ×2 IMPLANT
STAPLER CUT CVD 40MM BLUE (STAPLE) ×2 IMPLANT
STAPLER PROXIMATE 75MM BLUE (STAPLE) ×2 IMPLANT
STAPLER VISISTAT 35W (STAPLE) IMPLANT
SUCTION POOLE TIP (SUCTIONS) ×2 IMPLANT
SUT ETHILON 2 0 PS N (SUTURE) IMPLANT
SUT ETHILON 3 0 PS 1 (SUTURE) ×2 IMPLANT
SUT PDS AB 1 CTX 36 (SUTURE) IMPLANT
SUT PDS AB 1 TP1 96 (SUTURE) ×4 IMPLANT
SUT PROLENE 2 0 KS (SUTURE) IMPLANT
SUT PROLENE 2 0 SH DA (SUTURE) ×2 IMPLANT
SUT SILK 2 0 (SUTURE) ×1
SUT SILK 2 0 SH CR/8 (SUTURE) ×2 IMPLANT
SUT SILK 2-0 18XBRD TIE 12 (SUTURE) ×1 IMPLANT
SUT SILK 3 0 (SUTURE) ×1
SUT SILK 3 0 SH CR/8 (SUTURE) ×4 IMPLANT
SUT SILK 3-0 18XBRD TIE 12 (SUTURE) ×1 IMPLANT
SUT VICRYL 2 0 18  UND BR (SUTURE) ×1
SUT VICRYL 2 0 18 UND BR (SUTURE) ×1 IMPLANT
SYR BULB IRRIGATION 50ML (SYRINGE) ×2 IMPLANT
SYS LAPSCP GELPORT 120MM (MISCELLANEOUS)
SYSTEM LAPSCP GELPORT 120MM (MISCELLANEOUS) IMPLANT
TOWEL OR 17X26 10 PK STRL BLUE (TOWEL DISPOSABLE) ×4 IMPLANT
TOWEL OR NON WOVEN STRL DISP B (DISPOSABLE) ×4 IMPLANT
TRAY FOLEY CATH 14FRSI W/METER (CATHETERS) IMPLANT
TRAY LAP CHOLE (CUSTOM PROCEDURE TRAY) ×2 IMPLANT
TROCAR BLADELESS OPT 5 100 (ENDOMECHANICALS) ×2 IMPLANT
TROCAR XCEL BLUNT TIP 100MML (ENDOMECHANICALS) IMPLANT
TROCAR XCEL NON-BLD 11X100MML (ENDOMECHANICALS) IMPLANT
TUBING INSUFFLATION 10FT LAP (TUBING) ×2 IMPLANT
YANKAUER SUCT BULB TIP 10FT TU (MISCELLANEOUS) ×4 IMPLANT

## 2013-04-30 NOTE — Progress Notes (Signed)
Spoke with MD Rosenbower concerning patient's heart rate being elevated, no new orders stated to continue to monitor patient Stanford Breed RN 04-30-2013 13:33pm

## 2013-04-30 NOTE — H&P (Addendum)
Wesley Tran is an 52 y.o. male.   Chief Complaint:   He presents today for elective partial colectomy. HPI:  He underwent a screening colonoscopy at the end of August. This was done in [place]. This demonstrated a polyp in the transverse colon which was removed. This demonstrated a moderate to large mass in the sigmoid colon which was biopsied. The transverse colon polyp was benign. The sigmoid mass demonstrated a tubulovillous adenoma.  It could not be completely removed by way of colonoscopy. CT scan was performed which demonstrated the mass in the rectosigmoid area. CEA level was normal. He is asymptomatic and has no rectal bleeding.  He had a recent infection (URI)  and has completed his antibiotics. He is feeling much better. His white blood cell count was 19,000 a week ago and preoperative testing and is now down to 12,800.   Past Medical History  Diagnosis Date  . Colonic mass   . Cough 04/18/13    fever to 102 on Saturday with cough and congestion.  States no fever since then  . Hypercholesterolemia   . Tachycardia 04/18/13    per pt to 120 when smoking- happens occasionally- hasnt sought treatment    Past Surgical History  Procedure Laterality Date  . Colonoscopy  with biopsies      Family History  Problem Relation Age of Onset  . Cancer Mother     ovarian   Social History:  reports that he has been smoking.  He has never used smokeless tobacco. He reports that he drinks alcohol. He reports that he does not use illicit drugs.  Allergies: No Known Allergies  Medications Prior to Admission  Medication Sig Dispense Refill  . acetaminophen (TYLENOL) 500 MG tablet Take 1,000 mg by mouth every 6 (six) hours as needed for mild pain.      Marland Kitchen ibuprofen (ADVIL,MOTRIN) 200 MG tablet Take 200 mg by mouth every 4 (four) hours as needed for fever, headache or mild pain.        Results for orders placed during the hospital encounter of 04/30/13 (from the past 48 hour(s))  TYPE  AND SCREEN     Status: None   Collection Time    04/30/13  6:27 AM      Result Value Range   ABO/RH(D) O POS     Antibody Screen NEG     Sample Expiration 05/03/2013    CBC     Status: Abnormal   Collection Time    04/30/13  6:28 AM      Result Value Range   WBC 12.8 (*) 4.0 - 10.5 K/uL   RBC 4.73  4.22 - 5.81 MIL/uL   Hemoglobin 14.8  13.0 - 17.0 g/dL   HCT 40.9  81.1 - 91.4 %   MCV 92.4  78.0 - 100.0 fL   MCH 31.3  26.0 - 34.0 pg   MCHC 33.9  30.0 - 36.0 g/dL   RDW 78.2  95.6 - 21.3 %   Platelets 278  150 - 400 K/uL  ABO/RH     Status: None   Collection Time    04/30/13  6:30 AM      Result Value Range   ABO/RH(D) O POS     No results found.  Review of Systems  Constitutional: Negative for fever and chills.  Gastrointestinal: Negative for abdominal pain.    Blood pressure 141/91, pulse 97, temperature 97.8 F (36.6 C), temperature source Oral, resp. rate 18, SpO2 98.00%. Physical Exam  Constitutional: He appears well-developed and well-nourished. No distress.  HENT:  Head: Normocephalic and atraumatic.  Eyes: No scleral icterus.  Neck: Neck supple.  Cardiovascular: Normal rate and regular rhythm.   Respiratory: Effort normal and breath sounds normal.  GI: Soft. He exhibits no mass. There is no tenderness.  Musculoskeletal:  SCDs on.  Lymphadenopathy:    He has no cervical adenopathy.  Neurological: He is alert.  Skin: Skin is warm and dry.     Assessment/Plan  Large tubulovillous adenoma of the distal sigmoid colon. Cannot be removed by way of colonoscopy.  Plan: Laparoscopic assisted partial colectomy.  Jackson Fetters J 04/30/2013, 7:23 AM

## 2013-04-30 NOTE — Progress Notes (Signed)
Report received from [facility], introduction to patient made.  Assessed pain level which is being controlled by PCA and discussed patient IS.  Patient demonstrated use of IS to 1750.  Oxygen level on ETCO2 monitor 995% on 2 liters. Foley cath in place draining clear yellow urine.

## 2013-04-30 NOTE — Anesthesia Postprocedure Evaluation (Signed)
Anesthesia Post Note  Patient: Wesley Tran  Procedure(s) Performed: Procedure(s) (LRB): LAPAROSCOPIC ASSISTED LOWER ANTERIOR RESECTION, RIGID PROCTOSIGMOIDOSCOPY AND MARKING OF LESION WITH SPOT (N/A)  Anesthesia type: General  Patient location: PACU  Post pain: Pain level controlled  Post assessment: Post-op Vital signs reviewed  Last Vitals:  Filed Vitals:   04/30/13 1327  BP:   Pulse: 128  Temp:   Resp:     Post vital signs: Reviewed  Level of consciousness: sedated  Complications: No apparent anesthesia complications

## 2013-04-30 NOTE — Progress Notes (Signed)
Patient states he had a recent cold and finished taking an antibiotic.  Feels good now

## 2013-04-30 NOTE — Transfer of Care (Signed)
Immediate Anesthesia Transfer of Care Note  Patient: Wesley Tran  Procedure(s) Performed: Procedure(s): LAPAROSCOPIC ASSISTED LOWER ANTERIOR RESECTION, RIGID PROCTOSIGMOIDOSCOPY AND MARKING OF LESION WITH SPOT (N/A)  Patient Location: PACU  Anesthesia Type:General  Level of Consciousness: awake, alert , oriented and patient cooperative  Airway & Oxygen Therapy: Patient Spontanous Breathing and Patient connected to face mask oxygen  Post-op Assessment: Report given to PACU RN and Post -op Vital signs reviewed and stable  Post vital signs: Reviewed and stable  Complications: No apparent anesthesia complications

## 2013-04-30 NOTE — Progress Notes (Signed)
Paged MD Rosenbower regarding patient's elevated heart rate, awaiting callback Stanford Breed RN 04-30-2013 13:29pm

## 2013-04-30 NOTE — Op Note (Signed)
Operative Note  Wesley Tran male 52 y.o. 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  Rigid proctosigmoidoscopy with ink injection at the site of the lesion.  2.  Laparoscopic-assisted low anterior resection         Surgeon: Adolph Pollack   Assistants: Glenna Fellows  Anesthesia: General endotracheal anesthesia  Indications:  This is a 52 year old male who underwent screening colonoscopy earlier this year. There is a large mass in the sigmoid colon which was biopsied but which could not be removed. This demonstrated a tubulovillous adenoma. CT scan demonstrated this mass near the rectosigmoid area. He now presents for resection.    Procedure Detail:  He was seen in the holding room. He was brought to the operating room placed supine on the operating table and a general anesthetic was given. The hair on the abdominal wall was clipped. He subsequently was placed in the lithotomy position. A Foley catheter was inserted. Rigid proctosigmoidoscopy was performed and noted the inferior aspect of the lesion was 8-10 cm from the anal verge. Using Spot ink, I marked the distal extent of the polypoid lesion. Following this, the abdominal wall and perineal areas were sterilely prepped and draped.  A 5 mm incision was made in the left upper quadrant subcostal area. Using a 5 mm Optiview trocar and laparoscope access was gained into the peritoneal cavity and pneumoperitoneum was created. Inspecting the area beneath a trocar demonstrated no evidence of bleeding or organ injury. A 5 mm trocar was placed in the inferior epigastric region. A 5 mm trocar was placed in the right lower quadrant. A 5 mm trocar is placed in the lower midline.  He was noted to have some redundant sigmoid colon. I could not see the ink mark laparoscopically. Using sharp dissection the  colon was mobilized from the  mid descending colon down to the sigmoid colon. The proximal portion the rectum was also mobilized. A small serosal defect was made in the sigmoid colon and this was later repaired with interrupted 3-0 silk sutures.  There was good mobilization of the distal half of the descending colon and the sigmoid colon at this time.  A limited lower midline incision was then made after removing the lower midline trocar. The skin, subcutaneous tissue, fascia, peritoneum were divided. I was now able to palpate the lesion appeared to be in the proximal to mid rectum. I divided the sigmoid colon well proximal to the lesion with the GIA stapler. The right and left ureters were identified. The mesentery of the colon was then divided directly posteriorly with the LigaSure and down into the pelvis. The anterior peritoneum of the rectum was incised. The perirectal fat was mobilized circumferentially. The mesorectum was divided close to the pelvic walls and the sacrum using the LigaSure device. I was then able to identify the tattoo mark.  I mobilized the rectum below the tattoo mark. Proctosigmoidoscopy was then performed. A suture was placed at the distal extent of the polypoid mass. Approximately 2-3 cm distal to this the rectum was then divided with linear cutting stapler.  The specimen was taken to the back table and opened up.  There appeared to be at  Least a 2-3 cm margin between the staple line and the distal extent of the polypoid mass.  I then removed the staple line and proximal portion of sigmoid colon. I inserted a size 29 EEA anvil into  the sigmoid colon then sealed it with a GIA stapler. The anvil was then brought out the side of the distal sigmoid colon. It was secured with a 2-0 Prolene pursestring suture. Following this, the handle of the EEA stapler was passed through the anus into the rectum. An end rectum to side sigmoid colon (Baker fashion) anastomosis was then performed with the EEA stapler. 2 solid donuts  were noted. The distal rectal donut was sent for final distal margin.  The anastomosis was about 6 cm from the anal verge. Anastomosis was then placed under irrigation solution. The colon proximally anastomosis was occluded. Air was injected into the anus and there was no evidence of a leak.  The remaining trocars were removed. The abdominal cavity was copiously irrigated with saline solution. A #19 Blake drain was then placed into the pelvis through the right lower quadrant trocar site. It was anchored to the skin with a 3-0 nylon suture.  The fascial of the lower midline incision was then closed with a running double loop #1 PDS suture. The subcutaneous tissues irrigated and the skin of this incision was closed with staples. Needle, sponge, and instrument counts were reportedly correct. The 2 remaining trocar site incisions were closed with 4-0 Monocryl subcuticular sutures followed by Steri-Strips and sterile dressings. A sterile dressing was placed across the lower midline wound. The drain was hooked up to bulb suction.  He tolerated the procedure well without any apparent complications and was taken to recovery in satisfactory condition.   Estimated Blood Loss:  400 ml         Drains: #19 Blake drain  Blood Given: none          Specimens: Rectosigmoid colon. Distal rectal donut.        Complications:  * No complications entered in OR log *         Disposition: PACU - hemodynamically stable.         Condition: stable

## 2013-05-01 ENCOUNTER — Encounter (HOSPITAL_COMMUNITY): Payer: Self-pay | Admitting: General Surgery

## 2013-05-01 LAB — CBC
MCH: 31 pg (ref 26.0–34.0)
MCHC: 33.4 g/dL (ref 30.0–36.0)
MCV: 92.9 fL (ref 78.0–100.0)
Platelets: 273 10*3/uL (ref 150–400)
RBC: 4.22 MIL/uL (ref 4.22–5.81)
RDW: 13.4 % (ref 11.5–15.5)

## 2013-05-01 LAB — BASIC METABOLIC PANEL
BUN: 9 mg/dL (ref 6–23)
CO2: 26 mEq/L (ref 19–32)
Calcium: 9.3 mg/dL (ref 8.4–10.5)
Creatinine, Ser: 0.66 mg/dL (ref 0.50–1.35)
GFR calc Af Amer: >90 mL/min (ref >90)
GFR calc non Af Amer: >90 mL/min (ref >90)
Glucose, Bld: 135 mg/dL — ABNORMAL HIGH (ref 70–99)

## 2013-05-01 MED ORDER — ALUM & MAG HYDROXIDE-SIMETH 200-200-20 MG/5ML PO SUSP
30.0000 mL | Freq: Four times a day (QID) | ORAL | Status: DC | PRN
  Administered 2013-05-01 – 2013-05-03 (×3): 30 mL via ORAL
  Filled 2013-05-01 (×3): qty 30

## 2013-05-01 NOTE — Progress Notes (Signed)
Patient had episode of hiccoughs.  Hiccoughs episode lasted about fifteen minutes.  Patient is now resting in bed with wife and son at bedside.  Will continue to monitor patient.

## 2013-05-01 NOTE — Progress Notes (Signed)
1 Day Post-Op  Subjective: Adequate pain control.  No nausea.  We discussed events of the operation.  Objective: Vital signs in last 24 hours: Temp:  [97.6 F (36.4 C)-98.7 F (37.1 C)] 98.7 F (37.1 C) (12/03 0600) Pulse Rate:  [69-129] 89 (12/03 0600) Resp:  [12-20] 14 (12/03 0600) BP: (116-161)/(65-97) 126/69 mmHg (12/03 0600) SpO2:  [93 %-100 %] 97 % (12/03 0600) Weight:  [198 lb 14.2 oz (90.214 kg)] 198 lb 14.2 oz (90.214 kg) (12/02 1327)    Intake/Output from previous day: 12/02 0701 - 12/03 0700 In: 5493.8 [I.V.:5493.8] Out: 3845 [Urine:3210; Drains:235; Blood:400] Intake/Output this shift:    PE: General- In NAD Abdomen-soft, dressings dry, few bowel sounds, drain output serosanguinous  Lab Results:   Recent Labs  04/30/13 1431 05/01/13 0540  WBC 27.8* 20.3*  HGB 13.8 13.1  HCT 41.9 39.2  PLT 289 273   BMET  Recent Labs  04/30/13 1431 05/01/13 0540  NA  --  133*  K  --  4.2  CL  --  99  CO2  --  26  GLUCOSE  --  135*  BUN  --  9  CREATININE 0.69 0.66  CALCIUM  --  9.3   PT/INR No results found for this basename: LABPROT, INR,  in the last 72 hours Comprehensive Metabolic Panel:    Component Value Date/Time   NA 133* 05/01/2013 0540   K 4.2 05/01/2013 0540   CL 99 05/01/2013 0540   CO2 26 05/01/2013 0540   BUN 9 05/01/2013 0540   CREATININE 0.66 05/01/2013 0540   GLUCOSE 135* 05/01/2013 0540   CALCIUM 9.3 05/01/2013 0540   AST 20 04/18/2013 1020   ALT 19 04/18/2013 1020   ALKPHOS 255* 04/18/2013 1020   BILITOT 0.5 04/18/2013 1020   PROT 7.7 04/18/2013 1020   ALBUMIN 3.7 04/18/2013 1020     Studies/Results: No results found.  Anti-infectives: Anti-infectives   Start     Dose/Rate Route Frequency Ordered Stop   04/30/13 2000  cefoTEtan (CEFOTAN) 2 g in dextrose 5 % 50 mL IVPB     2 g 100 mL/hr over 30 Minutes Intravenous Every 12 hours 04/30/13 1326 04/30/13 2203   04/30/13 0611  cefOXitin (MEFOXIN) 2 g in dextrose 5 % 50 mL IVPB     2 g 100 mL/hr over 30 Minutes Intravenous On call to O.R. 04/30/13 0611 04/30/13 0738      Assessment Principal Problem:   Tubulovillous adenoma of rectum s/p lap assisted LAR 04/30/13-stable overnight course.    LOS: 1 day   Plan: OOB.  Start clear liquids.   Tazaria Dlugosz J 05/01/2013

## 2013-05-01 NOTE — Care Management Note (Signed)
    Page 1 of 1   05/01/2013     11:00:52 AM   CARE MANAGEMENT NOTE 05/01/2013  Patient:  Wesley Tran, Wesley Tran   Account Number:  [id number]  Date Initiated:  05/01/2013  Documentation initiated by:  Lorenda Ishihara  Subjective/Objective Assessment:   52 yo male admitted s/p LAR. PTA lived at home with spouse.     Action/Plan:   Home when stable   Anticipated DC Date:  05/04/2013   Anticipated DC Plan:  HOME/SELF CARE      DC Planning Services  CM consult      Choice offered to / List presented to:             Status of service:  Completed, signed off Medicare Important Message given?   (If response is "NO", the following Medicare IM given date fields will be blank) Date Medicare IM given:   Date Additional Medicare IM given:    Discharge Disposition:  HOME/SELF CARE  Per UR Regulation:  Reviewed for med. necessity/level of care/duration of stay  If discussed at Long Length of Stay Meetings, dates discussed:    Comments:

## 2013-05-01 NOTE — Progress Notes (Signed)
Pt c/o heartburn. md on call paged awaiting call back. Instructed pt to get up and move around. Pt stated he wanted to wait until am.

## 2013-05-02 NOTE — Progress Notes (Signed)
Pt foley catheter removed this a.m.  Pt unable to void and having some lower abdominal pressure/pain so was MD notified.  Order was given to reinsert foley catheter.  Catheter placed and yellow urine return noted.  Pt's lower abdominal pressure/pain relieved per pt.

## 2013-05-02 NOTE — Progress Notes (Signed)
2 Days Post-Op  Subjective: Had some hiccups and heartburn yesterday.  No flatus or BM.  Feels a little bloated.  Objective: Vital signs in last 24 hours: Temp:  [97.4 F (36.3 C)-98.6 F (37 C)] 98.5 F (36.9 C) (12/04 0537) Pulse Rate:  [75-97] 97 (12/04 0537) Resp:  [13-18] 18 (12/04 0537) BP: (128-167)/(79-90) 128/90 mmHg (12/04 0537) SpO2:  [96 %-98 %] 97 % (12/04 0537) FiO2 (%):  [36 %-37 %] 36 % (12/04 0400)    Intake/Output from previous day: 12/03 0701 - 12/04 0700 In: 2502.5 [P.O.:240; I.V.:2262.5] Out: 3350 [Urine:3225; Drains:125] Intake/Output this shift:    PE: General- In NAD Abdomen-soft, mild distension, dressings dry, few bowel sounds, drain output serosanguinous  Lab Results:   Recent Labs  04/30/13 1431 05/01/13 0540  WBC 27.8* 20.3*  HGB 13.8 13.1  HCT 41.9 39.2  PLT 289 273   BMET  Recent Labs  04/30/13 1431 05/01/13 0540  NA  --  133*  K  --  4.2  CL  --  99  CO2  --  26  GLUCOSE  --  135*  BUN  --  9  CREATININE 0.69 0.66  CALCIUM  --  9.3   PT/INR No results found for this basename: LABPROT, INR,  in the last 72 hours Comprehensive Metabolic Panel:    Component Value Date/Time   NA 133* 05/01/2013 0540   K 4.2 05/01/2013 0540   CL 99 05/01/2013 0540   CO2 26 05/01/2013 0540   BUN 9 05/01/2013 0540   CREATININE 0.66 05/01/2013 0540   GLUCOSE 135* 05/01/2013 0540   CALCIUM 9.3 05/01/2013 0540   AST 20 04/18/2013 1020   ALT 19 04/18/2013 1020   ALKPHOS 255* 04/18/2013 1020   BILITOT 0.5 04/18/2013 1020   PROT 7.7 04/18/2013 1020   ALBUMIN 3.7 04/18/2013 1020     Studies/Results: Pathology pending.  Anti-infectives: Anti-infectives   Start     Dose/Rate Route Frequency Ordered Stop   04/30/13 2000  cefoTEtan (CEFOTAN) 2 g in dextrose 5 % 50 mL IVPB     2 g 100 mL/hr over 30 Minutes Intravenous Every 12 hours 04/30/13 1326 04/30/13 2203   04/30/13 0611  cefOXitin (MEFOXIN) 2 g in dextrose 5 % 50 mL IVPB     2 g 100  mL/hr over 30 Minutes Intravenous On call to O.R. 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    LOS: 2 days   Plan:  Remove foley-done.  Ambulate.  Keep on clear liquids.  Decrease IVF.   Luellen Howson J 05/02/2013

## 2013-05-03 LAB — CBC
MCH: 30.6 pg (ref 26.0–34.0)
MCV: 93.7 fL (ref 78.0–100.0)
Platelets: 256 10*3/uL (ref 150–400)
RBC: 4.15 MIL/uL — ABNORMAL LOW (ref 4.22–5.81)
RDW: 13.4 % (ref 11.5–15.5)
WBC: 15.6 10*3/uL — ABNORMAL HIGH (ref 4.0–10.5)

## 2013-05-03 LAB — BASIC METABOLIC PANEL
BUN: 11 mg/dL (ref 6–23)
CO2: 26 mEq/L (ref 19–32)
Calcium: 9 mg/dL (ref 8.4–10.5)
Creatinine, Ser: 0.72 mg/dL (ref 0.50–1.35)
GFR calc Af Amer: >90 mL/min (ref >90)
GFR calc non Af Amer: >90 mL/min (ref >90)
Glucose, Bld: 121 mg/dL — ABNORMAL HIGH (ref 70–99)
Sodium: 134 mEq/L — ABNORMAL LOW (ref 135–145)

## 2013-05-03 MED ORDER — PROMETHAZINE HCL 25 MG/ML IJ SOLN
12.5000 mg | INTRAMUSCULAR | Status: DC | PRN
  Administered 2013-05-03 – 2013-05-04 (×2): 12.5 mg via INTRAVENOUS
  Filled 2013-05-03 (×2): qty 1

## 2013-05-03 MED ORDER — KETOROLAC TROMETHAMINE 30 MG/ML IJ SOLN
30.0000 mg | Freq: Four times a day (QID) | INTRAMUSCULAR | Status: AC
  Administered 2013-05-03 – 2013-05-04 (×6): 30 mg via INTRAVENOUS
  Filled 2013-05-03 (×8): qty 1

## 2013-05-03 NOTE — Progress Notes (Addendum)
3 Days Post-Op  Subjective: Unable to void yesterday after foley removed.  Foley had to be re-inserted last night.  Feels bloated and having nausea.  No flatus or BM.  Incisional pain.   Objective: Vital signs in last 24 hours: Temp:  [98.3 F (36.8 C)-98.5 F (36.9 C)] 98.4 F (36.9 C) (12/05 0558) Pulse Rate:  [75-82] 81 (12/05 0558) Resp:  [10-18] 15 (12/05 0558) BP: (131-136)/(69-87) 136/77 mmHg (12/05 0558) SpO2:  [94 %-99 %] 94 % (12/05 0558)    Intake/Output from previous day: 12/04 0701 - 12/05 0700 In: 2480 [P.O.:720; I.V.:1760] Out: 1205 [Urine:950; Drains:255] Intake/Output this shift:    PE: General- In NAD Abdomen-soft, more distension, incisions clean and intact, hypoactive bowel sounds, drain output serosanguinous  Lab Results:   Recent Labs  04/30/13 1431 05/01/13 0540  WBC 27.8* 20.3*  HGB 13.8 13.1  HCT 41.9 39.2  PLT 289 273   BMET  Recent Labs  04/30/13 1431 05/01/13 0540  NA  --  133*  K  --  4.2  CL  --  99  CO2  --  26  GLUCOSE  --  135*  BUN  --  9  CREATININE 0.69 0.66  CALCIUM  --  9.3   PT/INR No results found for this basename: LABPROT, INR,  in the last 72 hours Comprehensive Metabolic Panel:    Component Value Date/Time   NA 133* 05/01/2013 0540   K 4.2 05/01/2013 0540   CL 99 05/01/2013 0540   CO2 26 05/01/2013 0540   BUN 9 05/01/2013 0540   CREATININE 0.66 05/01/2013 0540   GLUCOSE 135* 05/01/2013 0540   CALCIUM 9.3 05/01/2013 0540   AST 20 04/18/2013 1020   ALT 19 04/18/2013 1020   ALKPHOS 255* 04/18/2013 1020   BILITOT 0.5 04/18/2013 1020   PROT 7.7 04/18/2013 1020   ALBUMIN 3.7 04/18/2013 1020     Studies/Results: Pathology 7.6 cm tubullovillous adenoma, no malignancy seen, 8 benign lymph nodes.  Anti-infectives: Anti-infectives   Start     Dose/Rate Route Frequency Ordered Stop   04/30/13 2000  cefoTEtan (CEFOTAN) 2 g in dextrose 5 % 50 mL IVPB     2 g 100 mL/hr over 30 Minutes Intravenous Every 12 hours  04/30/13 1326 04/30/13 2203   04/30/13 0611  cefOXitin (MEFOXIN) 2 g in dextrose 5 % 50 mL IVPB     2 g 100 mL/hr over 30 Minutes Intravenous On call to O.R. 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   Acute urinary retention-foley re-inserted last night    LOS: 3 days   Plan:  Decrease diet to ice chips.  Wait for ileus to resolve.  Add Toradol for incisional pain to help decrease narcotic use.  Check labs.  Voiding trial Sunday or Monday.   Wesley Tran 05/03/2013

## 2013-05-04 NOTE — Progress Notes (Signed)
General Surgery Note  LOS: 4 days  POD -  4 Days Post-Op  Assessment/Plan: 1.  LAPAROSCOPIC ASSISTED LOWER ANTERIOR RESECTION, RIGID PROCTOSIGMOIDOSCOPY AND MARKING OF LESION WITH SPOT - 12/2/20214 - T. Rosenbower  Had BM.  Will start clear liquids.  2.  DVT prophylaxis - SQ Heparin 3.  Urinary retention  Patient wants foley out, but will leave at least one more day.   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  Wants foley out.  Had 2 BM.  No nausea.  Objective:   Filed Vitals:   05/04/13 0852  BP:   Pulse:   Temp:   Resp: 14     Intake/Output from previous day:  12/05 0701 - 12/06 0700 In: 1760 [I.V.:1760] Out: 1569 [Urine:1425; Drains:144]  Intake/Output this shift:      Physical Exam:   General: WN M who is alert and oriented.    HEENT: Normal. Pupils equal. .   Lungs: Clear.  Good inspiration.   Abdomen: Mild distention.  Soft.  BS present.   Wound: Clean.  Right sided drain.  Drain - 144cc.   Lab Results:    Recent Labs  05/03/13 0822  WBC 15.6*  HGB 12.7*  HCT 38.9*  PLT 256    BMET   Recent Labs  05/03/13 0822  NA 134*  K 4.0  CL 100  CO2 26  GLUCOSE 121*  BUN 11  CREATININE 0.72  CALCIUM 9.0    PT/INR  No results found for this basename: LABPROT, INR,  in the last 72 hours  ABG  No results found for this basename: PHART, PCO2, PO2, HCO3,  in the last 72 hours   Studies/Results:  No results found.   Anti-infectives:   Anti-infectives   Start     Dose/Rate Route Frequency Ordered Stop   04/30/13 2000  cefoTEtan (CEFOTAN) 2 g in dextrose 5 % 50 mL IVPB     2 g 100 mL/hr over 30 Minutes Intravenous Every 12 hours 04/30/13 1326 04/30/13 2203   04/30/13 0611  cefOXitin (MEFOXIN) 2 g in dextrose 5 % 50 mL IVPB     2 g 100 mL/hr over 30 Minutes Intravenous On call to O.R. 04/30/13 0611 04/30/13 4098      Ovidio Kin, MD, FACS Pager: [phone] [facility] [place]  [facility] Office: [phone] 05/04/2013

## 2013-05-05 MED ORDER — ONDANSETRON HCL 4 MG/2ML IJ SOLN: 4.0000 mg | Freq: Once | INTRAMUSCULAR | Status: DC

## 2013-05-05 MED ORDER — HYDROCODONE-ACETAMINOPHEN 5-325 MG PO TABS
1.0000 | ORAL_TABLET | ORAL | Status: DC | PRN
  Administered 2013-05-05: 1 via ORAL
  Filled 2013-05-05: qty 1

## 2013-05-05 NOTE — Progress Notes (Signed)
General Surgery Note  LOS: 5 days  POD -  5 Days Post-Op  Assessment/Plan: 1.  LAPAROSCOPIC ASSISTED LOWER ANTERIOR RESECTION, RIGID PROCTOSIGMOIDOSCOPY AND MARKING OF LESION WITH SPOT - 12/2/20214 - T. Rosenbower  Had BM again today  Will advance to full liquids.  Looks good.  2.  DVT prophylaxis - SQ Heparin 3.  Urinary retention  To try foley out again.  He's anxious to get it out.   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  Wants foley out.  No other complaint.  Objective:   Filed Vitals:   05/05/13 0520  BP: 160/78  Pulse: 80  Temp: 98.1 F (36.7 C)  Resp: 16     Intake/Output from previous day:  12/06 0701 - 12/07 0700 In: 2080 [I.V.:2080] Out: 1415 [Urine:1350; Drains:65]  Intake/Output this shift:      Physical Exam:   General: WN M who is alert and oriented.    HEENT: Normal. Pupils equal. .   Lungs: Clear.  Good inspiration.   Abdomen:  Soft.  BS present.   Wound: Clean.  Right sided drain.  Drain - 65 cc.   Lab Results:     Recent Labs  05/03/13 0822  WBC 15.6*  HGB 12.7*  HCT 38.9*  PLT 256    BMET    Recent Labs  05/03/13 0822  NA 134*  K 4.0  CL 100  CO2 26  GLUCOSE 121*  BUN 11  CREATININE 0.72  CALCIUM 9.0    PT/INR  No results found for this basename: LABPROT, INR,  in the last 72 hours  ABG  No results found for this basename: PHART, PCO2, PO2, HCO3,  in the last 72 hours   Studies/Results:  No results found.   Anti-infectives:   Anti-infectives   Start     Dose/Rate Route Frequency Ordered Stop   04/30/13 2000  cefoTEtan (CEFOTAN) 2 g in dextrose 5 % 50 mL IVPB     2 g 100 mL/hr over 30 Minutes Intravenous Every 12 hours 04/30/13 1326 04/30/13 2203   04/30/13 0611  cefOXitin (MEFOXIN) 2 g in dextrose 5 % 50 mL IVPB     2 g 100 mL/hr over 30 Minutes Intravenous On call to O.R. 04/30/13 0611 04/30/13 1610      Ovidio Kin, MD, FACS Pager:  [phone] [facility] [place] [facility] Office: [phone] 05/05/2013

## 2013-05-06 MED ORDER — HYDROCODONE-ACETAMINOPHEN 5-325 MG PO TABS: 1.0000 | ORAL_TABLET | ORAL | Status: DC | PRN

## 2013-05-06 NOTE — Discharge Summary (Signed)
Physician Discharge Summary  Patient ID: Wesley Tran MRN: [id number] DOB/AGE: 06-07-1960 52 y.o.  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  The final pathology demonstrated a 7.6 cm tubulovillous adenoma with no evidence of malignancy.         Postoperatively he developed an ileus and acute urinary retention. Both of these slowly resolved. He was started on liquid diet and slowly advanced to a solid diet the day of his discharge which he tolerated. His Foley catheter had to be reinserted  and was removed on his fifth postoperative day. He voided well after this.  By his sixth postoperative day he was ambulating independently, his wounds were clean and intact, his bowels were moving, he was tolerating a diet, and his drain was able to be removed.  He was discharged to home. Discharge instructions were given to him and his wife. He will return to the office in 4 days for staple removal.  Consults: None  Significant Diagnostic Studies: none  Treatments: surgery: Laparoscopic-assisted low anterior resection  Discharge Exam: Blood pressure 165/97, pulse 91, temperature 98.4 F (36.9 C), temperature source Oral, resp. rate 18, height 5\' 10"  (1.778 m), weight 198 lb 14.2 oz (90.214 kg), SpO2 94.00%.   Disposition: 01-Home or Self Care   Future Appointments Provider Department Dept Phone   05/10/2013 2:00 PM [facility] Nurse [facility] [place] [facility], [place] [phone]   05/15/2013 10:40 AM Adolph Pollack, MD [facility] Surgery, [place] [phone]       Medication List    STOP taking these  medications       acetaminophen 500 MG tablet  Commonly known as:  TYLENOL      TAKE these medications       HYDROcodone-acetaminophen 5-325 MG per tablet  Commonly known as:  NORCO/VICODIN  Take 1-2 tablets by mouth every 4 (four) hours as needed for moderate pain.     ibuprofen 200 MG tablet  Commonly known as:  ADVIL,MOTRIN  Take 200 mg by mouth every 4 (four) hours as needed for fever, headache or mild pain.         Signed: Adolph Pollack 05/06/2013, 2:24 PM

## 2013-05-06 NOTE — Progress Notes (Signed)
Pt has been drinking and ambulating throughout night. Has voided a total of 1100 cc since 2300, 05/05/13. PVR 429 cc. Pt does not want to be I/O cathed at present.

## 2013-05-06 NOTE — Progress Notes (Signed)
6 Days Post-Op  Subjective: Tolerating full liquid diet.  Bowels moving.  Voiding well.  Interested in going home today.  Objective: Vital signs in last 24 hours: Temp:  [98 F (36.7 C)-98.4 F (36.9 C)] 98.4 F (36.9 C) (12/08 0645) Pulse Rate:  [89-97] 91 (12/08 0645) Resp:  [18] 18 (12/08 0645) BP: (137-171)/(87-97) 165/97 mmHg (12/08 0645) SpO2:  [94 %-95 %] 94 % (12/08 0645) Last BM Date: 05/04/13  Intake/Output from previous day: 12/07 0701 - 12/08 0700 In: 2820 [P.O.:900; I.V.:1920] Out: 1610 [RUEAV:4098; Drains:230] Intake/Output this shift:    PE: General- In NAD Abdomen-soft, incisions are clean and intact, serous drain output  Lab Results:  No results found for this basename: WBC, HGB, HCT, PLT,  in the last 72 hours BMET No results found for this basename: NA, K, CL, CO2, GLUCOSE, BUN, CREATININE, CALCIUM,  in the last 72 hours PT/INR No results found for this basename: LABPROT, INR,  in the last 72 hours Comprehensive Metabolic Panel:    Component Value Date/Time   NA 134* 05/03/2013 0822   K 4.0 05/03/2013 0822   CL 100 05/03/2013 0822   CO2 26 05/03/2013 0822   BUN 11 05/03/2013 0822   CREATININE 0.72 05/03/2013 0822   GLUCOSE 121* 05/03/2013 0822   CALCIUM 9.0 05/03/2013 0822   AST 20 04/18/2013 1020   ALT 19 04/18/2013 1020   ALKPHOS 255* 04/18/2013 1020   BILITOT 0.5 04/18/2013 1020   PROT 7.7 04/18/2013 1020   ALBUMIN 3.7 04/18/2013 1020     Studies/Results: No results found.  Anti-infectives: Anti-infectives   Start     Dose/Rate Route Frequency Ordered Stop   04/30/13 2000  cefoTEtan (CEFOTAN) 2 g in dextrose 5 % 50 mL IVPB     2 g 100 mL/hr over 30 Minutes Intravenous Every 12 hours 04/30/13 1326 04/30/13 2203   04/30/13 0611  cefOXitin (MEFOXIN) 2 g in dextrose 5 % 50 mL IVPB     2 g 100 mL/hr over 30 Minutes Intravenous On call to O.R. 04/30/13 0611 04/30/13 0738      Assessment Principal Problem:   Tubulovillous adenoma of rectum  s/p lap assisted LAR 04/30/13-doing well; bowel function has returned; urinary retention has resolved.    LOS: 6 days   Plan:  Remove drain-done.  Heplock IV, advance diet, home later today if diet tolerated.  Discharge instructions given to him and his wife.   Wesley Tran 05/06/2013

## 2013-05-10 ENCOUNTER — Ambulatory Visit (INDEPENDENT_AMBULATORY_CARE_PROVIDER_SITE_OTHER): Payer: BC Managed Care – PPO

## 2013-05-10 DIAGNOSIS — Z4802 Encounter for removal of sutures: Secondary | ICD-10-CM

## 2013-05-10 NOTE — Progress Notes (Addendum)
Pt is s/p laparoscopic assisted lower anterior resection and rigid proctosigmoidoscopy by Dr. Abbey Chatters on 04/30/13.  He is here today to have his staples removed.  Bandages and staples removed without complication.  Incision is clean and healing well.  Steri strips were placed.  Pt was given instructions on showering, and reminded of his post op appointment with Dr. Abbey Chatters.

## 2013-05-15 ENCOUNTER — Encounter (INDEPENDENT_AMBULATORY_CARE_PROVIDER_SITE_OTHER): Payer: Self-pay | Admitting: General Surgery

## 2013-05-15 ENCOUNTER — Ambulatory Visit (INDEPENDENT_AMBULATORY_CARE_PROVIDER_SITE_OTHER): Payer: BC Managed Care – PPO | Admitting: General Surgery

## 2013-05-15 VITALS — BP 130/84 | HR 100 | Temp 99.0°F | Resp 14 | Ht 68.0 in | Wt 188.2 lb

## 2013-05-15 DIAGNOSIS — Z4889 Encounter for other specified surgical aftercare: Secondary | ICD-10-CM

## 2013-05-15 NOTE — Progress Notes (Signed)
Procedure:  A laparoscopic-assisted low anterior resection  Date:  04/30/2013  Pathology:  Benign large tubulovillous adenoma  History:  He is here for a postoperative visit. He has no pain. He has had some problems with constipation and took some MiraLAX recently. The stools are very hard. He is eating well.  Exam: General- Is in NAD. Abdomen-soft, incisions are clean, intact, and solid.  Assessment:  Some postoperative constipation otherwise doing well.  Plan:  Increase water intake. Stool softener twice a day. Use milk of magnesia or MiraLAX for constipation. Continue light activities. Increase walking. Return visit one month.

## 2013-05-15 NOTE — Patient Instructions (Signed)
Take a stool softener twice a day. Using milk of magnesia or MiraLAX for constipation. Drink more water.

## 2013-05-20 ENCOUNTER — Encounter (HOSPITAL_COMMUNITY): Payer: Self-pay | Admitting: General Surgery

## 2013-06-06 ENCOUNTER — Encounter (INDEPENDENT_AMBULATORY_CARE_PROVIDER_SITE_OTHER): Payer: Self-pay

## 2013-06-19 ENCOUNTER — Ambulatory Visit (INDEPENDENT_AMBULATORY_CARE_PROVIDER_SITE_OTHER): Payer: BC Managed Care – PPO | Admitting: General Surgery

## 2013-06-19 ENCOUNTER — Encounter (INDEPENDENT_AMBULATORY_CARE_PROVIDER_SITE_OTHER): Payer: Self-pay | Admitting: General Surgery

## 2013-06-19 VITALS — BP 130/90 | HR 76 | Temp 97.9°F | Resp 14 | Ht 70.0 in | Wt 193.2 lb

## 2013-06-19 DIAGNOSIS — Z4889 Encounter for other specified surgical aftercare: Secondary | ICD-10-CM

## 2013-06-19 NOTE — Progress Notes (Signed)
Procedure:  Laparoscopic-assisted low anterior resection  Date:  04/30/2013  Pathology:  Benign large tubulovillous adenoma  History:  He is here for his second postoperative visit. He is back to light duty work and feeling good. He is eating. Still has some incisional soreness at times. His bowels are a bit irregular. Some days he'll have multiple bowel movements. Other days he will have none. Exam: General- Is in NAD. Abdomen-soft, incisions are clean, intact, and solid.  Assessment:  Having some bowel irregularity otherwise doing well. We had discussed last time it may take him a number of months before he knows what his normal bowel habit will be.  Plan:  They return to full duty work and regular activities in 2 weeks. Likely will need repeat colonoscopy one year after his surgery and he prefers to see Dr. Watt Climes for this. Return visit as needed.

## 2013-06-19 NOTE — Patient Instructions (Addendum)
May return to your regular work and activity in 2 weeks. Call if you have any questions or problems.

## 2014-05-15 ENCOUNTER — Other Ambulatory Visit: Payer: Self-pay | Admitting: Gastroenterology

## 2015-01-08 IMAGING — CR DG CHEST 2V
2 series · 2 of 2 positions shown · non-contrast
Comparison: None.

CLINICAL DATA: Preop for colectomy

EXAM:
CHEST  2 VIEW

[w chest pa]
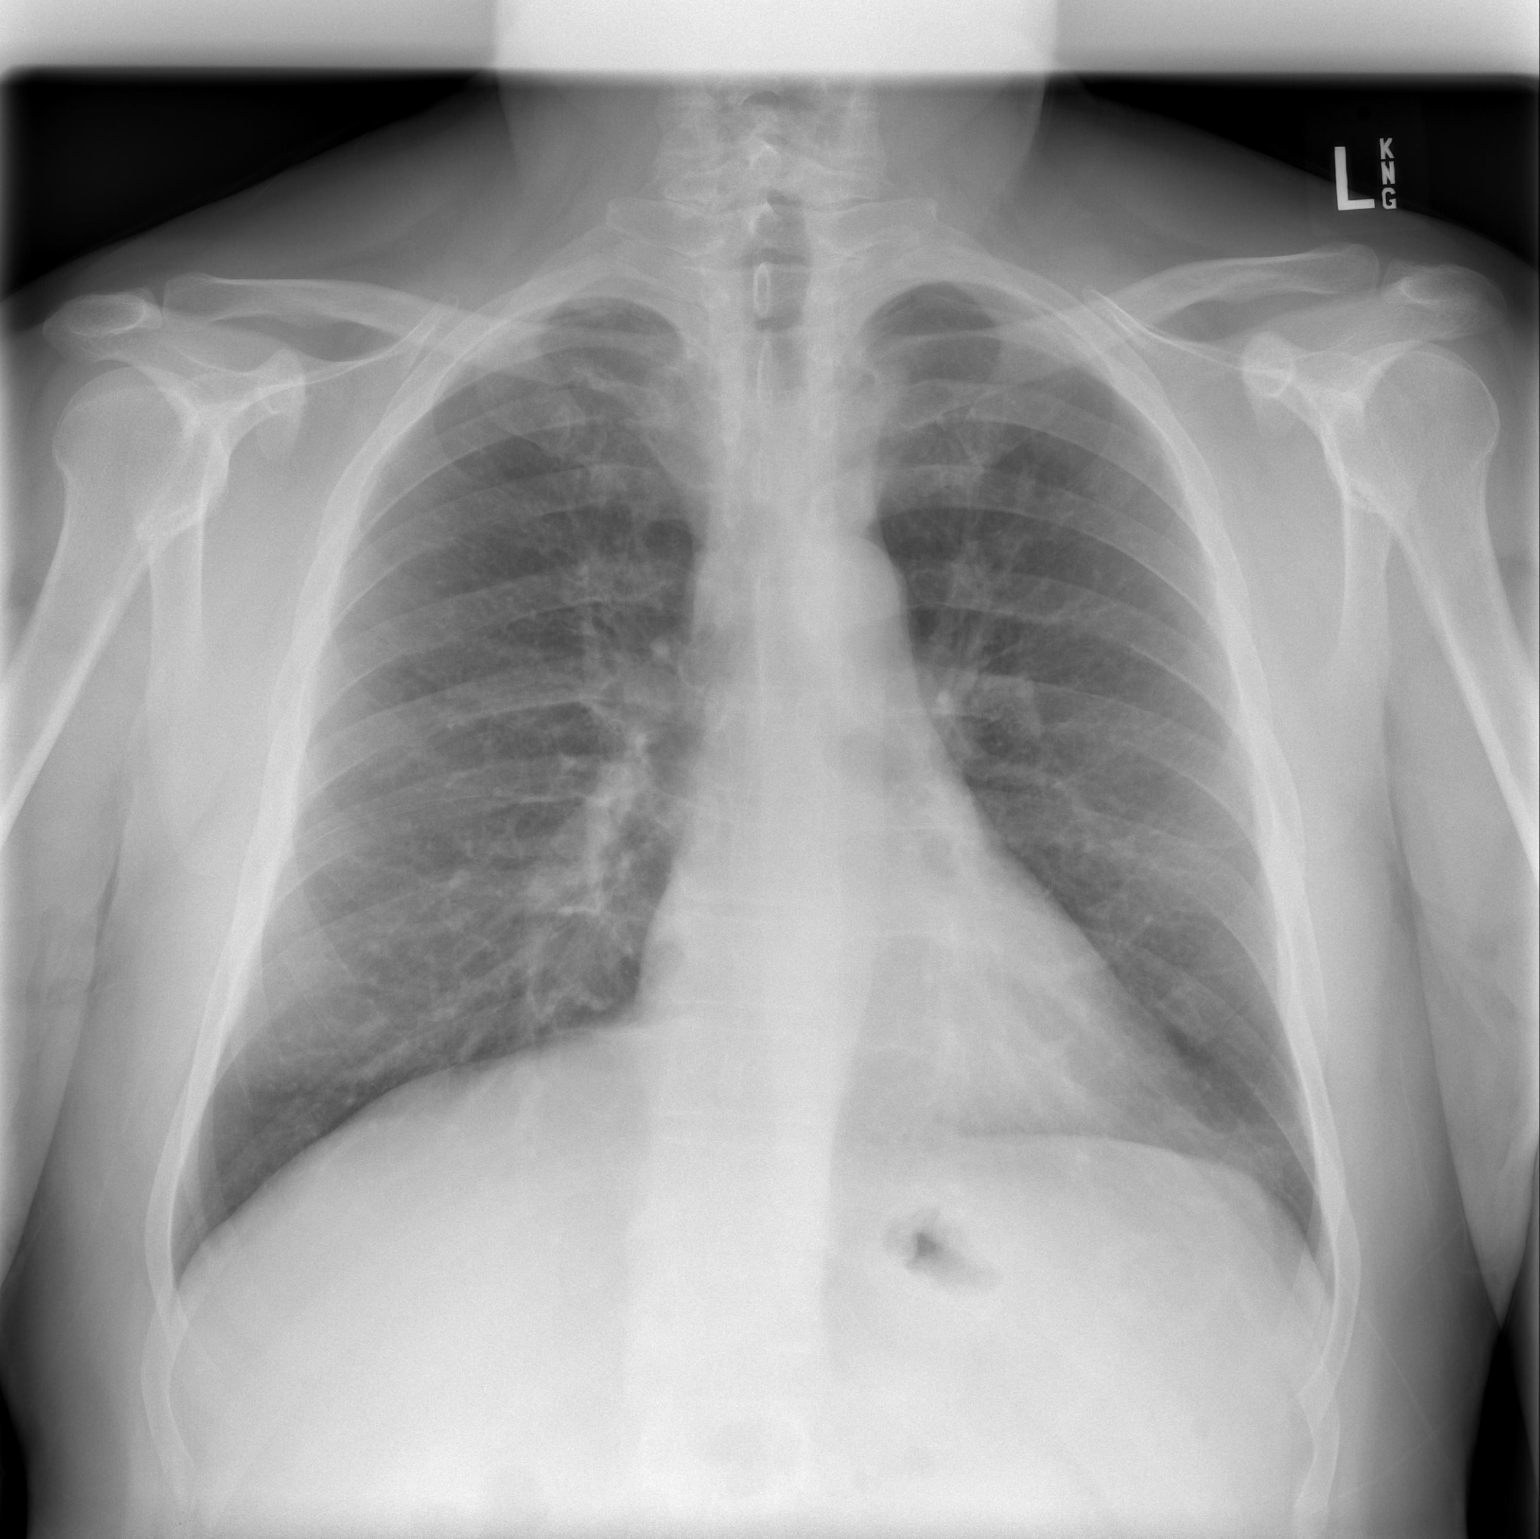

[w chest lat]
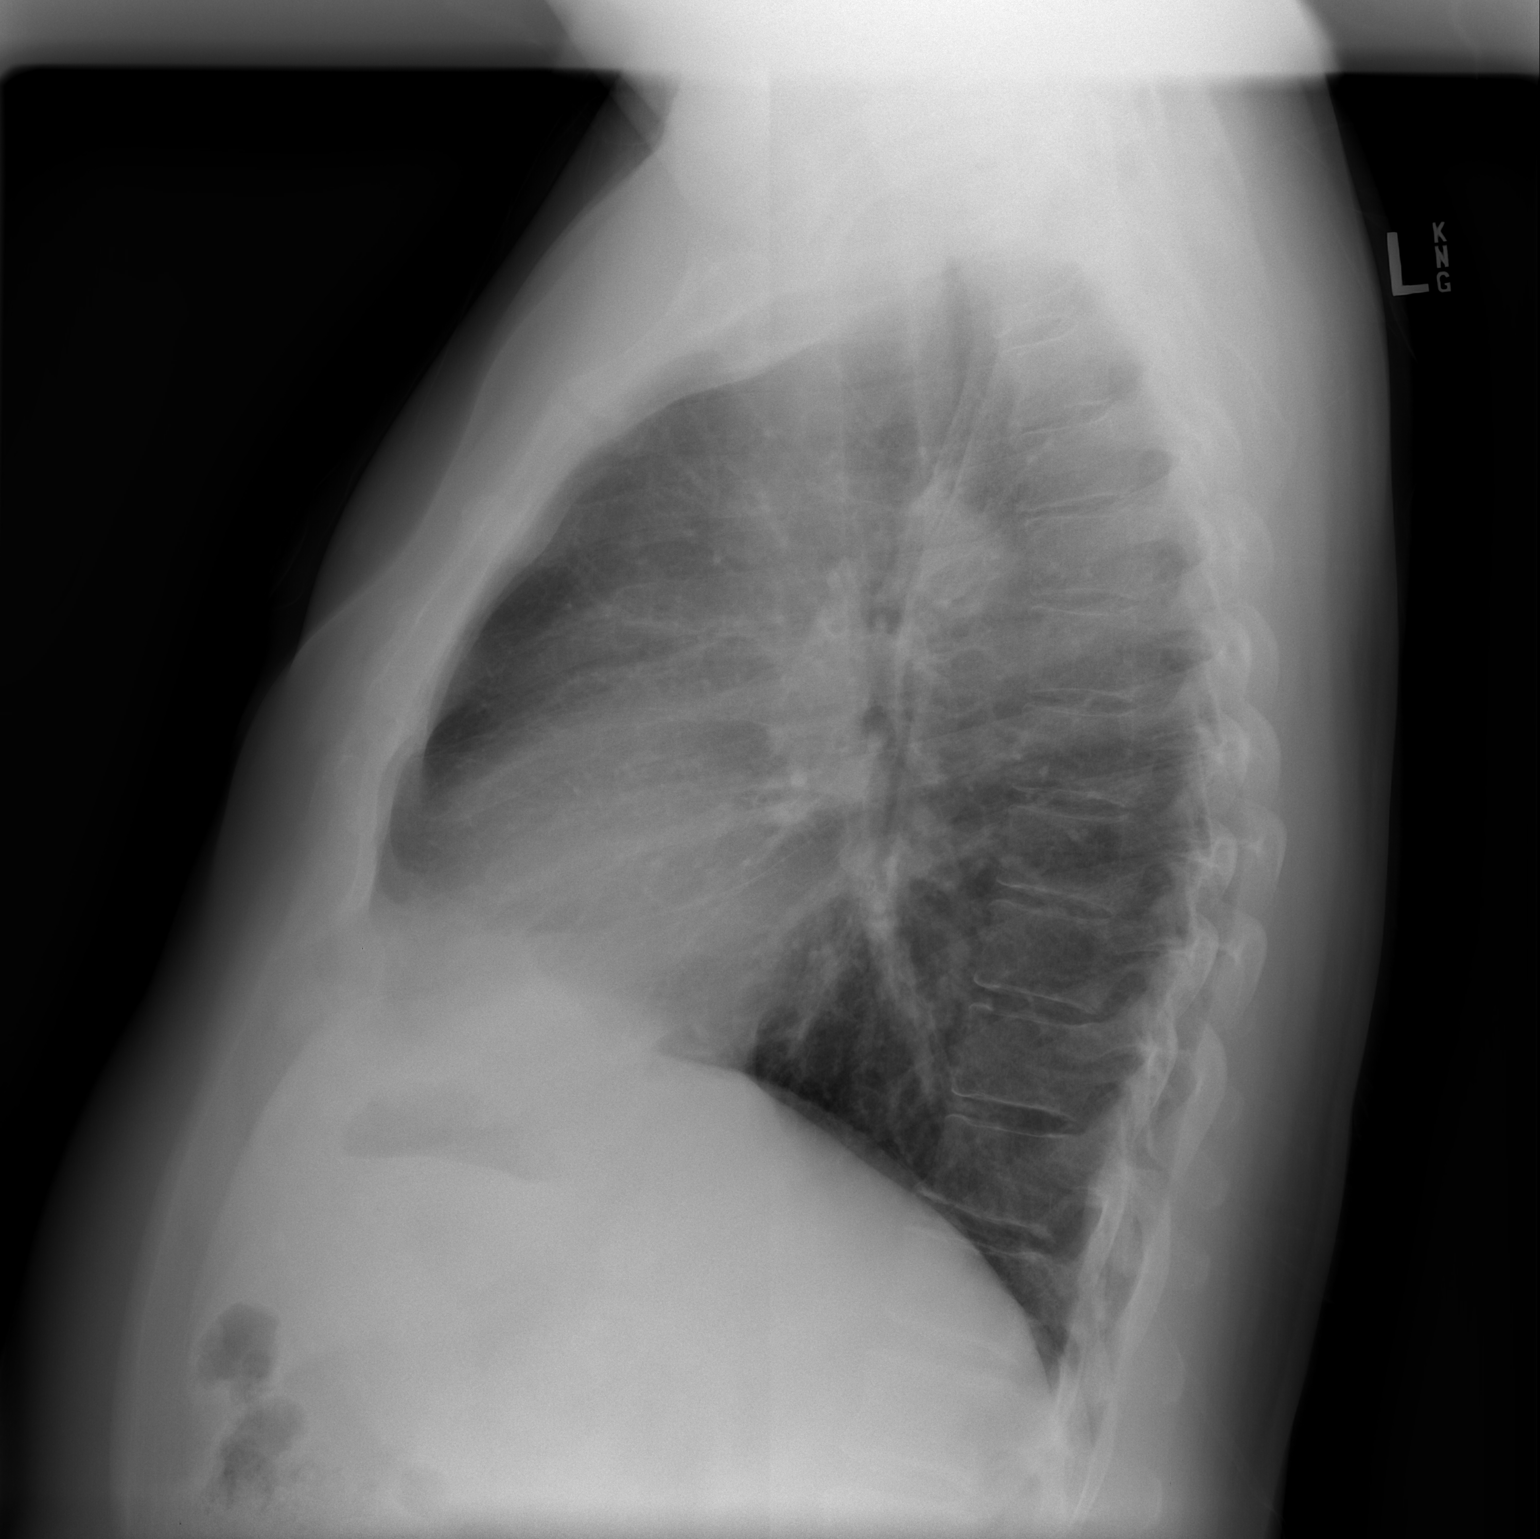

[2 of 2 positions shown; findings below may reference images not displayed]

FINDINGS: The heart size and mediastinal contours are within normal limits.
Both lungs are clear. The visualized skeletal structures are
unremarkable.
IMPRESSION: No active cardiopulmonary disease.
